# Patient Record
Sex: Male | Born: 1985 | Hispanic: No | Marital: Single | Smoking: Never smoker
Health system: Southern US, Community
[De-identification: ages and names within clinical notes are randomized; demographics above are authoritative.]

---

## 2014-06-21 ENCOUNTER — Encounter (HOSPITAL_COMMUNITY): Payer: Self-pay | Admitting: Emergency Medicine

## 2014-06-21 ENCOUNTER — Emergency Department (HOSPITAL_COMMUNITY)
Admission: EM | Admit: 2014-06-21 | Discharge: 2014-06-21 | Disposition: A | Discharge status: Home / Self Care | Payer: BC Managed Care – PPO | Source: Home / Self Care | Attending: Family Medicine | Admitting: Family Medicine

## 2014-06-21 DIAGNOSIS — L255 Unspecified contact dermatitis due to plants, except food: Secondary | ICD-10-CM

## 2014-06-21 DIAGNOSIS — L237 Allergic contact dermatitis due to plants, except food: Secondary | ICD-10-CM

## 2014-06-21 MED ORDER — METHYLPREDNISOLONE ACETATE 40 MG/ML IJ SUSP
80.0000 mg | Freq: Once | INTRAMUSCULAR | Status: AC
  Administered 2014-06-21: 80 mg via INTRAMUSCULAR

## 2014-06-21 MED ORDER — METHYLPREDNISOLONE ACETATE 80 MG/ML IJ SUSP
INTRAMUSCULAR | Status: AC
  Filled 2014-06-21: qty 1

## 2014-06-21 MED ORDER — TRIAMCINOLONE ACETONIDE 40 MG/ML IJ SUSP
40.0000 mg | Freq: Once | INTRAMUSCULAR | Status: AC
  Administered 2014-06-21: 40 mg via INTRAMUSCULAR

## 2014-06-21 MED ORDER — HYDROXYZINE HCL 25 MG PO TABS: 25.0000 mg | ORAL_TABLET | Freq: Four times a day (QID) | ORAL | Status: AC

## 2014-06-21 MED ORDER — TRIAMCINOLONE ACETONIDE 40 MG/ML IJ SUSP
INTRAMUSCULAR | Status: AC
  Filled 2014-06-21: qty 1

## 2014-06-21 NOTE — ED Notes (Signed)
C/o rash on bilateral lower/upper extremities onset 5 days Reports he was cutting a tree down was poss exposed to poison ivy Denies f/v/n/d Taking benadryl w/no relief.  Alert, no signs of acute distress.

## 2014-06-21 NOTE — ED Provider Notes (Signed)
CSN: [id number]     Arrival date & time 06/21/14  1550 History   First MD Initiated Contact with Patient 06/21/14 1638     Chief Complaint  Patient presents with  . Poison Ivy   (Consider location/radiation/quality/duration/timing/severity/associated sxs/prior Treatment) Patient is a 28 y.o. male presenting with poison ivy. The history is provided by the patient.  Poison [place] This is a new problem. The current episode started more than 2 days ago (in yard cutting trees and vines.). The problem has been gradually worsening.    History reviewed. No pertinent past medical history. History reviewed. No pertinent past surgical history. No family history on file. History  Substance Use Topics  . Smoking status: Never Smoker   . Smokeless tobacco: Not on file  . Alcohol Use: No    Review of Systems  Constitutional: Negative.   Skin: Positive for rash.    Allergies  Review of patient's allergies indicates no known allergies.  Home Medications   Prior to Admission medications   Medication Sig Start Date End Date Taking? Authorizing Provider  hydrOXYzine (ATARAX/VISTARIL) 25 MG tablet Take 1 tablet (25 mg total) by mouth every 6 (six) hours. Prn itching 06/21/14   Linna Hoff, MD   BP 118/66  Pulse 56  Temp(Src) 97.8 F (36.6 C) (Oral)  Resp 16  SpO2 100% Physical Exam  Nursing note and vitals reviewed. Constitutional: He is oriented to person, place, and time. He appears well-developed and well-nourished.  Neck: Normal range of motion. Neck supple.  Neurological: He is alert and oriented to person, place, and time.  Skin: Skin is warm and dry. Rash noted.  Patchy papulovesicular pruritic rash on ext.    ED Course  Procedures (including critical care time) Labs Review Labs Reviewed - No data to display  Imaging Review No results found.   MDM   1. Contact dermatitis due to poison ivy        Linna Hoff, MD 06/21/14 1649

## 2016-12-08 ENCOUNTER — Other Ambulatory Visit: Payer: Self-pay | Admitting: Internal Medicine

## 2016-12-08 DIAGNOSIS — M25562 Pain in left knee: Secondary | ICD-10-CM

## 2016-12-23 ENCOUNTER — Ambulatory Visit
Admission: RE | Admit: 2016-12-23 | Discharge: 2016-12-23 | Disposition: A | Discharge status: Home / Self Care | Payer: Self-pay | Source: Ambulatory Visit | Attending: Internal Medicine | Admitting: Internal Medicine

## 2016-12-23 DIAGNOSIS — M25562 Pain in left knee: Secondary | ICD-10-CM
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
